# Patient Record
Sex: Male | Born: 1970 | Race: White | Hispanic: No | Marital: Single
Health system: Southern US, Community
[De-identification: ages and names within clinical notes are randomized; demographics above are authoritative.]

## PROBLEM LIST (undated history)

## (undated) DIAGNOSIS — N200 Calculus of kidney: Secondary | ICD-10-CM

## (undated) DIAGNOSIS — I10 Essential (primary) hypertension: Secondary | ICD-10-CM

## (undated) HISTORY — PX: ESOPHAGEAL DILATION: SHX303

## (undated) HISTORY — PX: VASECTOMY: SHX75

## (undated) MED ORDER — AZITHROMYCIN 250 MG TAB
250 mg | ORAL_TABLET | ORAL | Status: DC
Start: ? — End: 2013-05-25

---

## 2011-10-09 MED ORDER — TADALAFIL 20 MG TABLET
20 mg | ORAL_TABLET | ORAL | Status: DC | PRN
Start: 2011-10-09 — End: 2011-10-23

## 2011-10-09 NOTE — Patient Instructions (Signed)
Erectile Dysfunction: After Your Visit  Your Care Instructions  A man has erectile dysfunction (ED) when he cannot consistently get or maintain an erection that allows satisfactory sex. He may not be able to have an erection at any time, or he may not be able to have one that is firm enough or lasts long enough to complete sexual intercourse.  Erectile dysfunction is not the same as having trouble getting an erection every now and then. This is common and happens to most men at some time in their lives.  Erection problems are often caused by problems with the blood vessels, nerves, or hormones. These can be caused by health problems such as diabetes, heart disease, injuries, or nerve disorders like multiple sclerosis or Parkinson???s disease. Medicines, alcohol, and tobacco are other causes. Erection problems can also have a psychological or emotional cause like depression, stress, grief, or relationship problems.  Follow-up care is a key part of your treatment and safety. Be sure to make and go to all appointments, and call your doctor if you are having problems. It???s also a good idea to know your test results and keep a list of the medicines you take.  How can you care for yourself at home?  ?? Limit alcohol. Have no more than 2 drinks a day.   ?? Do not smoke. Smoking makes it harder for the blood vessels in the penis to relax and let blood flow in. If you need help quitting, talk to your doctor about stop-smoking programs and medicines. These can increase your chances of quitting for good.   ?? Do not use cocaine, heroin, or other illegal drugs.   ?? Tell your doctor about all the medicines that you take. Some medicines can cause erection problems. Also, some medicines can have dangerous interactions with medicines that are prescribed for ED, including over-the-counter medicines and herbal products. Take your medicines exactly as prescribed. Call your doctor if you think you are having a problem with your medicine.    ?? Try to reduce stress.   ?? Give yourself time to adjust to change. Changes in your job, family, relationships, home life, and other areas can cause stress that adds to erection problems.   ?? Relax. Take time for more foreplay. Worrying about your erections may only make things worse.   ?? Talk with your partner about your concerns:   ?? Couples often assume that they each know what the other person likes when it comes to sex. Sometimes they are wrong. Talk about what each of you does and does not enjoy.   ?? Make time outside of the bedroom to talk about your sex life. If you avoid sex because you are afraid of having erection problems, your partner may worry that you are no longer interested.   ?? If you and your partner have trouble talking about sex, see a therapist who can help you talk about it together. Reading books with your partner about sexual health may also help.   ?? Talk to your doctor about trying Viagra, Levitra, or Cialis. If you have a heart problem, ask your doctor whether these are safe for you.   When should you call for help?  Call your doctor now or seek immediate medical care if:  ?? You have an erection that lasts longer than 3 hours.   ?? You have taken Viagra, Levitra, or Cialis in the past 24 hours, and you have chest pain. Do not take nitroglycerin.   ??   You have erection problems that occur along with pain or difficulty with urination, fever, or pain in the lower belly.   Watch closely for changes in your health, and be sure to contact your doctor if you have any problems.    Where can you learn more?    Go to http://www.healthwise.net/[facility]   Enter J531 in the search box to learn more about "Erectile Dysfunction: After Your Visit."    ?? 2006-2013 Health[employer], Incorporated. Care instructions adapted under license by [employer] (which disclaims liability or warranty for this information). This care instruction is for use with your licensed healthcare professional. If you have questions  about a medical condition or this instruction, always ask your healthcare professional. Healthwise, Incorporated disclaims any warranty or liability for your use of this information.  Content Version: 9.7.130178; Last Revised: January 26, 2009

## 2011-10-09 NOTE — Progress Notes (Signed)
HISTORY OF PRESENT ILLNESS  Nathan Mcintyre is a 41 y.o. male.  HPI Comments: Erectile Dysfunction  Patient complains of erectile dysfunction.  Onset of dysfunction was 2 months ago and was gradual in onset.  Patient states the nature of difficulty is maintaining erection. Full erections occur never. Partial erections occur with intercourse, with masturbation, on awakening, while asleep, spontaneously. Libido is not affected. Risk factors for ED include possible risk factor CAD - patient is a smoker. We do not have any fasting labs. Patient denies history of cardiovascular disease, diabetes mellitus, neurologic disease, urologic disease, beta blocker use, antihypertensive medications, and hypogonadism. Patient's expectations as to sexual function would like to have sexual intercourse at least every night if not several nights per week.  Patient's description of relationship w/partner very loving and supportive. He does not feel any anxiety with her.  Previous treatment of ED includes Cialis - but it did not really help. He was on the 5 mg tablet. .         New Patient    Medication Refill        Review of Systems   All other systems reviewed and are negative.        Physical Exam   Nursing note and vitals reviewed.  Constitutional: He is oriented to person, place, and time.   Cardiovascular: Normal rate and regular rhythm.    Pulmonary/Chest: Effort normal and breath sounds normal.   Musculoskeletal: Normal range of motion.   Neurological: He is alert and oriented to person, place, and time. He has normal reflexes.   Skin: Skin is warm.   Psychiatric: He has a normal mood and affect. His behavior is normal.       ASSESSMENT and PLAN  Nathan Mcintyre was seen today for new patient and for evaluation of erectile dysfunction. He needs to have fasting labs and testosterone checked. Prostate exam was normal. Discussed smoking cessation. Will give Cialis 20 mgs and discussed the way to use the higher dose. He will return in the  morning for the fasting labs.     Diagnoses and associated orders for this visit:    Erectile dysfunction  - TESTOSTERONE, TOTAL, ADULT MALE  - tadalafil (CIALIS) 20 mg tablet; Take 20 mg by mouth as needed ( 20 mg 1-2 hours prior to anticipated sexual activity; may increase to 20 mg ORALLY or decrease ).    Routine physical examination  - LIPID CASCADE  - THYROID CASCADE PROFILE  - TESTOSTERONE, TOTAL, ADULT MALE  - METABOLIC PANEL, COMPREHENSIVE  - CBC WITH AUTOMATED DIFF    Follow-up Disposition:  Return if symptoms worsen or fail to improve.

## 2011-10-10 NOTE — Progress Notes (Signed)
Patient presented for labs as follow up from yesterday's visit.

## 2011-10-13 LAB — CBC WITH AUTOMATED DIFF
ABS. BASOPHILS: 0 10*3/uL (ref 0.0–0.2)
ABS. EOSINOPHILS: 0.1 10*3/uL (ref 0.0–0.4)
ABS. IMM. GRANS.: 0 10*3/uL (ref 0.0–0.1)
ABS. MONOCYTES: 1.2 10*3/uL — ABNORMAL HIGH (ref 0.1–0.9)
ABS. NEUTROPHILS: 10.3 10*3/uL — ABNORMAL HIGH (ref 1.4–7.0)
Abs Lymphocytes: 2.3 10*3/uL (ref 0.7–3.1)
BASOPHILS: 0 % (ref 0–3)
EOSINOPHILS: 1 % (ref 0–5)
HCT: 51.5 % — ABNORMAL HIGH (ref 37.5–51.0)
HGB: 17.3 g/dL (ref 12.6–17.7)
IMMATURE GRANULOCYTES: 0 % (ref 0–2)
Lymphocytes: 17 % (ref 14–46)
MCH: 30.4 pg (ref 26.6–33.0)
MCHC: 33.6 g/dL (ref 31.5–35.7)
MCV: 90 fL (ref 79–97)
MONOCYTES: 8 % (ref 4–12)
NEUTROPHILS: 74 % (ref 40–74)
PLATELET: 361 10*3/uL (ref 155–379)
RBC: 5.7 x10E6/uL (ref 4.14–5.80)
RDW: 14.3 % (ref 12.3–15.4)
WBC: 14 10*3/uL — ABNORMAL HIGH (ref 3.4–10.8)

## 2011-10-13 LAB — METABOLIC PANEL, COMPREHENSIVE
A-G Ratio: 1.5 (ref 1.1–2.5)
ALT (SGPT): 19 IU/L (ref 0–44)
AST (SGOT): 20 IU/L (ref 0–40)
Albumin: 4.3 g/dL (ref 3.5–5.5)
Alk. phosphatase: 121 IU/L — ABNORMAL HIGH (ref 44–102)
BUN/Creatinine ratio: 14 (ref 9–20)
BUN: 11 mg/dL (ref 6–24)
Bilirubin, total: 0.4 mg/dL (ref 0.0–1.2)
CO2: 22 mmol/L (ref 19–28)
Calcium: 9.1 mg/dL (ref 8.7–10.2)
Chloride: 103 mmol/L (ref 97–108)
Creatinine: 0.8 mg/dL (ref 0.76–1.27)
GFR est AA: 126 mL/min/{1.73_m2} (ref >59)
GFR est non-AA: 109 mL/min/{1.73_m2} (ref >59)
GLOBULIN, TOTAL: 2.8 g/dL (ref 1.5–4.5)
Glucose: 81 mg/dL (ref 65–99)
Potassium: 4.6 mmol/L (ref 3.5–5.2)
Protein, total: 7.1 g/dL (ref 6.0–8.5)
Sodium: 141 mmol/L (ref 134–144)

## 2011-10-13 LAB — LIPID CASCADE
Cholesterol, total: 180 mg/dL (ref 100–199)
HDL Cholesterol: 54 mg/dL (ref >39)
LDL, calculated: 108 mg/dL — ABNORMAL HIGH (ref 0–99)
LDL/HDL Ratio: 2 ratio units (ref 0.0–3.6)
Non-HDL Cholesterol: 126 mg/dL (ref 0–129)
Triglyceride: 88 mg/dL (ref 0–149)

## 2011-10-13 LAB — LIPOPROTEIN ANALYSIS, BY NMR
HDL-P (Total): 32.4 umol/L (ref >=30.5)
LDL size: 21.3 nm (ref >20.5)
LDL-P: 1261 nmol/L — ABNORMAL HIGH (ref <1000)
LP-IR SCORE: 34 (ref <=45)
Small LDL-P: 385 nmol/L (ref <=527)

## 2011-10-13 LAB — TESTOSTERONE, TOTAL, ADULT MALE: Testosterone: 470 ng/dL (ref 348–1197)

## 2011-10-13 LAB — THYROID CASCADE PROFILE: TSH: 0.715 u[IU]/mL (ref 0.450–4.500)

## 2011-10-23 MED ORDER — TADALAFIL 5 MG TABLET
5 mg | ORAL_TABLET | ORAL | Status: AC | PRN
Start: 2011-10-23 — End: 2011-11-06

## 2011-10-23 MED ORDER — BUPROPION SR 150 MG TAB
150 mg | ORAL_TABLET | Freq: Every day | ORAL | Status: AC
Start: 2011-10-23 — End: 2011-11-22

## 2011-10-23 NOTE — Telephone Encounter (Signed)
Patient called to receive lab results from labs done on 9/26

## 2011-10-23 NOTE — Telephone Encounter (Signed)
Patient coming in today to discuss erectile dysfunction issues due to current medication not working any more

## 2011-10-23 NOTE — Progress Notes (Signed)
Patient presents for erectile dysfunction due to current medication not working at all. Patient also asking for his flu vaccine at this time Reviewed record in preparation for visit and have obtained necessary documentation.

## 2011-10-24 LAB — CBC WITH AUTOMATED DIFF
ABS. BASOPHILS: 0 10*3/uL (ref 0.0–0.2)
ABS. EOSINOPHILS: 0.2 10*3/uL (ref 0.0–0.4)
ABS. IMM. GRANS.: 0 10*3/uL (ref 0.0–0.1)
ABS. MONOCYTES: 1 10*3/uL — ABNORMAL HIGH (ref 0.1–0.9)
ABS. NEUTROPHILS: 8.4 10*3/uL — ABNORMAL HIGH (ref 1.4–7.0)
Abs Lymphocytes: 3.5 10*3/uL — ABNORMAL HIGH (ref 0.7–3.1)
BASOPHILS: 0 % (ref 0–3)
EOSINOPHILS: 2 % (ref 0–5)
HCT: 50.2 % (ref 37.5–51.0)
HGB: 16.9 g/dL (ref 12.6–17.7)
IMMATURE GRANULOCYTES: 0 % (ref 0–2)
Lymphocytes: 26 % (ref 14–46)
MCH: 30.1 pg (ref 26.6–33.0)
MCHC: 33.7 g/dL (ref 31.5–35.7)
MCV: 90 fL (ref 79–97)
MONOCYTES: 8 % (ref 4–12)
NEUTROPHILS: 64 % (ref 40–74)
PLATELET: 354 10*3/uL (ref 155–379)
RBC: 5.61 x10E6/uL (ref 4.14–5.80)
RDW: 14.2 % (ref 12.3–15.4)
WBC: 13.2 10*3/uL — ABNORMAL HIGH (ref 3.4–10.8)

## 2011-10-24 NOTE — Progress Notes (Signed)
HISTORY OF PRESENT ILLNESS  Nathan Mcintyre is a 41 y.o. male.  HPI Comments: Who presents for ongoing problems with erectile dysfunction. He does not have any offending medications. No hypertension. Testosterone levels were normal. He is not overweight. He has tried Cialis and also Viagra with minimal improvement. His labs have all returned normal. He does smoke and drinks minimally. He is discouraged about this. He is in a new relationship for the past 29-months and denies performance anxiety.       Review of Systems   All other systems reviewed and are negative.        Physical Exam   Nursing note and vitals reviewed.  Cardiovascular: Normal rate and regular rhythm.    Pulmonary/Chest: Effort normal and breath sounds normal.       ASSESSMENT and PLAN  Lajohn was seen today for erectile dysfunction and immunization/injection. Will refer to urology. He did receive the influenza vaccine today. Will follow up after referral. Will add Wellbutrin and daily cialis to his regimen. He had taken the 20mg  Cialis previously.     Diagnoses and associated orders for this visit:    Erectile dysfunction  - REFERRAL TO UROLOGY  - buPROPion SR (WELLBUTRIN SR) 150 mg SR tablet; Take 1 Tab by mouth daily for 30 days.  - tadalafil (CIALIS) 5 mg tablet; Take 5 mg by mouth as needed for 14 days.    Need for prophylactic vaccination and inoculation against influenza  - INFLUENZA VIRUS VACCINE, SPLIT, PRES. FREE, IN INDIVIDS. >=3 YRS OF AGE, IM    Libido, decreased  - buPROPion SR (WELLBUTRIN SR) 150 mg SR tablet; Take 1 Tab by mouth daily for 30 days.  - tadalafil (CIALIS) 5 mg tablet; Take 5 mg by mouth as needed for 14 days.    Abnormal cbc  - CBC WITH AUTOMATED DIFF    Follow-up Disposition:  Return if symptoms worsen or fail to improve.

## 2012-01-21 NOTE — Progress Notes (Signed)
HISTORY OF PRESENT ILLNESS  Nathan Mcintyre is a 42 y.o. male.  HPI Comments: Who presents today with onset of cough and low grade fever. Fever is gone today, but now having itchy and scratchy throat and nasal congestion. He is a smoker. He continues to smoke.     Cough    Fever   Associated symptoms include cough.   Light Sensitivity        Review of Systems   Constitutional: Positive for fever.   Eyes: Positive for photophobia.   Respiratory: Positive for cough.    All other systems reviewed and are negative.        Physical Exam   Nursing note and vitals reviewed.  Constitutional:   Fatigued     HENT:   Head: Normocephalic.   Eyes: Pupils are equal, round, and reactive to light.   Neck: Normal range of motion.   Cardiovascular: Normal rate and regular rhythm.    Pulmonary/Chest: No respiratory distress. He has no wheezes.   Bronchospastic cough   Musculoskeletal: Normal range of motion.   Neurological: He is alert.   Skin: Skin is warm.       ASSESSMENT and PLAN  [name] was seen today for cough, fever and light sensitivity.    Diagnoses and associated orders for this visit:    URI (upper respiratory infection)  - azithromycin (ZITHROMAX Z-PAK) 250 mg tablet; Take two tablets today then one tablet daily    Photophobia of both eyes    Follow-up Disposition:  Return if symptoms worsen or fail to improve.

## 2012-01-21 NOTE — Progress Notes (Signed)
Patient started with dry cough and slight fever this am. Also complains of light sensitivity for the past month mostly during the day. Denies any other medical issues at this time. Med reconciliation reviewed and up to date with  corrections made with patient at this time.  Reviewed record in preparation for visit and have obtained necessary documentation.

## 2013-05-25 NOTE — Progress Notes (Signed)
Identified pt with two pt identifiers(name and DOB).    Chief Complaint   Patient presents with   ??? Complete Physical     pt is fasting        Health Maintenance Due   Topic   ??? TDAP AGE > 18    ??? Td Q 10 Yrs Age > 18        Wt Readings from Last 3 Encounters:   05/25/13 189 lb (85.73 kg)   01/21/12 202 lb 6.4 oz (91.808 kg)   10/23/11 196 lb 3.2 oz (88.996 kg)     Temp Readings from Last 3 Encounters:   05/25/13 98 ??F (36.7 ??C) Oral   01/21/12 98.2 ??F (36.8 ??C) Oral   10/23/11 97.5 ??F (36.4 ??C) Oral     BP Readings from Last 3 Encounters:   05/25/13 138/80   01/21/12 129/75   10/23/11 117/78     Pulse Readings from Last 3 Encounters:   05/25/13 64   01/21/12 77   10/23/11 62         Learning Assessment:  :     Learning Assessment 01/21/2012   PRIMARY LEARNER Patient   HIGHEST LEVEL OF EDUCATION - PRIMARY LEARNER  DID NOT GRADUATE HIGH SCHOOL   BARRIERS PRIMARY LEARNER NONE   CO-LEARNER CAREGIVER No   PRIMARY LANGUAGE ENGLISH   LEARNER PREFERENCE PRIMARY DEMONSTRATION   ANSWERED BY patient   RELATIONSHIP SELF       Depression Screening:  :     PHQ 2 / 9, over the last two weeks 05/25/2013   Little interest or pleasure in doing things Not at all   Feeling down, depressed or hopeless Not at all   Total Score PHQ 2 0       Fall Risk Assessment:  :         Coordination of Care Questionnaire:  :     1) Have you been to an emergency room, urgent care clinic since your last visit? no   Hospitalized since your last visit? no             2) Have you seen or consulted any other health care providers outside of [facility] since your last visit? no  (Include any pap smears or colon screenings in this section.)    3) Do you have an Advance Directive on file? no  Are you interested in receiving information about Advance Directives? no    Patient is accompanied by self I have received verbal consent from Mallie DartingWilliam A Perona to discuss any/all medical information while they are present in the room.    Reviewed record in  preparation for visit and have obtained necessary documentation.

## 2013-05-25 NOTE — Patient Instructions (Signed)
The Mediterranean Diet: After Your Visit  Your Care Instructions  The Mediterranean diet features foods eaten in [place], [place], southern [place] and [place], and other countries that border the [place] Sea. It emphasizes eating a diet rich in fruits, vegetables, nuts, and high-fiber grains, and limits meat, cheese, and sweets.  The Mediterranean diet may:  ?? Prevent heart disease and lower the risk of a heart attack or stroke.  ?? Prevent type 2 diabetes.  ?? Prevent Alzheimer's disease and other dementia.  ?? Prevent depression.  ?? Prevent Parkinson's disease.  This diet contains more fat than other heart-healthy diets. But the fats are mainly from nuts, unsaturated oils, such as fish oils, olive oil, and certain nut or seed oils (such as canola, soybean, or flaxseed oil). These types of oils may help protect the heart and blood vessels.  Follow-up care is a key part of your treatment and safety. Be sure to make and go to all appointments, and call your doctor if you are having problems. It's also a good idea to know your test results and keep a list of the medicines you take.  How can you care for yourself at home?  What to eat  ?? Eat a variety of fruits and vegetables each day, such as grapes, blueberries, tomatoes, broccoli, peppers, figs, olives, spinach, eggplant, beans, lentils, and chickpeas.  ?? Eat a variety of whole-grain foods each day, such as oats, brown rice, and whole wheat bread, pasta, and couscous.  ?? Eat fish at least 2 times a week. Try tuna, salmon, mackerel, lake trout, herring, or sardines.  ?? Eat moderate amounts of low-fat dairy products each day or weekly, such as milk, cheese, or yogurt.  ?? Eat moderate amounts of poultry and eggs every 2 days or weekly.  ?? Choose healthy (unsaturated) fats, such as nuts, olive oil and certain nut or seed oils like canola, soybean, and flaxseed.  ?? Limit unhealthy (saturated) fats, such as butter, palm oil, and coconut oil. And limit fats found in animal  products, such as meat and dairy products made with whole milk. Try to eat red meat only a few times a month in very small amounts.  ?? Limit sweets and desserts to only a few times a week. This includes sugar-sweetened drinks like soda.  The Mediterranean diet may also include red wine with your meal???1 glass each day for women and up to 2 glasses a day for men.  Tips for changing your diet  ?? Dip bread in a mix of olive oil and fresh herbs instead of using butter.  ?? Add avocado slices to your sandwich instead of bacon.  ?? Have fish for lunch or dinner instead of red meat. Brush the fish with olive oil, and broil or grill it.  ?? Sprinkle your salad with seeds or nuts instead of cheese.  ?? Cook with olive or canola oil instead of butter or oils that are high in saturated fat.  ?? Switch from 2% milk or whole milk to 1% or fat-free milk.  ?? Dip raw vegetables in a vinaigrette dressing or hummus instead of dips made from mayonnaise or sour cream.  ?? Have a piece of fruit for dessert instead of a piece of cake. Try baked apples, or have some dried fruit.  Part of the Mediterranean diet is being active. Get at least 30 minutes of exercise on most days of the week. Walking is a good choice. You also may want to do other activities,   such as running, swimming, cycling, or playing tennis or team sports.   Where can you learn more?   Go to http://www.healthwise.net/[facility]  Enter O407 in the search box to learn more about "The Mediterranean Diet: After Your Visit."   ?? 2006-2015 Healthwise, Incorporated. Care instructions adapted under license by [employer] (which disclaims liability or warranty for this information). This care instruction is for use with your licensed healthcare professional. If you have questions about a medical condition or this instruction, always ask your healthcare professional. Healthwise, Incorporated disclaims any warranty or liability for your use of this information.  Content Version:  10.4.390249; Current as of: November 27, 2012

## 2013-05-25 NOTE — Progress Notes (Signed)
Subjective:     Nathan Mcintyre is a [age] y.o. male prMallie Dartingesenting for annual exam and complete physical.    Patient Active Problem List    Diagnosis Date Noted   ??? Erectile dysfunction 10/23/2011       No Known Allergies  History reviewed. No pertinent past medical history.  Past Surgical History   Procedure Laterality Date   ??? Hx vasectomy  2001     Family History   Problem Relation Age of Onset   ??? Cancer Father      lymph node     History   Substance Use Topics   ??? Smoking status: Current Every Day Smoker -- 0.50 packs/day for 20 years   ??? Smokeless tobacco: Never Used   ??? Alcohol Use: 1.0 oz/week     2 Cans of beer per week      Comment: occ          Component Value Date/Time   WBC 13.2 10/23/2011  4:28 PM   HGB 16.9 10/23/2011  4:28 PM   HCT 50.2 10/23/2011  4:28 PM   PLATELET 354 10/23/2011  4:28 PM   MCV 90 10/23/2011  4:28 PM       Component Value Date/Time   Cholesterol, total 180 10/10/2011  8:56 AM   HDL Cholesterol 54 10/10/2011  8:56 AM   LDL, calculated 108 10/10/2011  8:56 AM   Triglyceride 88 10/10/2011  8:56 AM     No results found for this basename: PSA, PSA2, PSAR1, [id number], PSA1, PSAR2, PSA3, PSAR3, [id number], [id number], [id number], [id number], PSALT    Component Value Date/Time   TSH 0.715 10/10/2011  8:56 AM         Review of Systems  A comprehensive review of systems was negative.    Objective:     BP 138/80   Pulse 64   Temp(Src) 98 ??F (36.7 ??C) (Oral)   Resp 16   Ht 5' 11.5" (1.816 m)   Wt 189 lb (85.73 kg)   BMI 26.00 kg/m2   SpO2 97%  Physical exam:   General appearance - alert, well appearing, and in no distress and normal appearing weight  Mental status - alert, oriented to person, place, and time  Eyes - pupils equal and reactive, extraocular eye movements intact  Ears - bilateral TM's and external ear canals normal  Nose - normal and patent, no erythema, discharge or polyps  Mouth - mucous membranes moist, pharynx normal without lesions  Neck - supple, no significant adenopathy  Lymphatics - no palpable  lymphadenopathy, no hepatosplenomegaly  Chest - clear to auscultation, no wheezes, rales or rhonchi, symmetric air entry  Heart - normal rate, regular rhythm, normal S1, S2, no murmurs, rubs, clicks or gallops  Abdomen - soft, nontender, nondistended, no masses or organomegaly  Back exam - full range of motion, no tenderness, palpable spasm or pain on motion  Neurological - alert, oriented, normal speech, no focal findings or movement disorder noted  Musculoskeletal - no joint tenderness, deformity or swelling  Extremities - peripheral pulses normal, no pedal edema, no clubbing or cyanosis  Skin - normal coloration and turgor, no rashes, no suspicious skin lesions noted     Assessment/Plan:       increase physical activity, routine labs ordered, call if any problems.     ICD-9-CM    1. Physical exam V70.9 CBC W/O DIFF     METABOLIC PANEL, COMPREHENSIVE     LIPID PANEL  THYROID CASCADE PROFILE   2. Need for diphtheria-tetanus-pertussis (Tdap) vaccine V06.1 TETANUS, DIPHTHERIA TOXOIDS AND ACELLULAR PERTUSSIS VACCINE (TDAP), IN INDIVIDS. >=7, IM   3. Screening for cholesterol level V77.91 LIPID PANEL   4. Screening for prostate cancer V76.44 PROSTATE SPECIFIC AG (PSA)   5. Screening for thyroid disorder V77.0 THYROID CASCADE PROFILE   .  Informed patient that we will notify him when his labs return, and inform him of any change in plan of care at that time.  Educated patient about common side effects of the vaccine, and informed patient that he will be due for another booster in 10 years.    Pt informed to return to office with worsening of symptoms, or PRN with any questions or concerns.  Pt verbalizes understanding of plan of care and denies further questions or concerns at this time.

## 2013-05-26 LAB — METABOLIC PANEL, COMPREHENSIVE
A-G Ratio: 1.8 (ref 1.1–2.5)
ALT (SGPT): 14 IU/L (ref 0–44)
AST (SGOT): 16 IU/L (ref 0–40)
Albumin: 4.6 g/dL (ref 3.5–5.5)
Alk. phosphatase: 116 IU/L (ref 39–117)
BUN/Creatinine ratio: 16 (ref 9–20)
BUN: 12 mg/dL (ref 6–24)
Bilirubin, total: 0.4 mg/dL (ref 0.0–1.2)
CO2: 23 mmol/L (ref 18–29)
Calcium: 9.6 mg/dL (ref 8.7–10.2)
Chloride: 100 mmol/L (ref 97–108)
Creatinine: 0.73 mg/dL — ABNORMAL LOW (ref 0.76–1.27)
GFR est AA: 130 mL/min/{1.73_m2} (ref >59)
GFR est non-AA: 112 mL/min/{1.73_m2} (ref >59)
GLOBULIN, TOTAL: 2.5 g/dL (ref 1.5–4.5)
Glucose: 90 mg/dL (ref 65–99)
Potassium: 4.7 mmol/L (ref 3.5–5.2)
Protein, total: 7.1 g/dL (ref 6.0–8.5)
Sodium: 140 mmol/L (ref 134–144)

## 2013-05-26 LAB — CBC W/O DIFF
HCT: 49.9 % (ref 37.5–51.0)
HGB: 16.4 g/dL (ref 12.6–17.7)
MCH: 30.2 pg (ref 26.6–33.0)
MCHC: 32.9 g/dL (ref 31.5–35.7)
MCV: 92 fL (ref 79–97)
PLATELET: 309 10*3/uL (ref 150–379)
RBC: 5.43 x10E6/uL (ref 4.14–5.80)
RDW: 14.5 % (ref 12.3–15.4)
WBC: 11.4 10*3/uL — ABNORMAL HIGH (ref 3.4–10.8)

## 2013-05-26 LAB — CVD REPORT

## 2013-05-26 LAB — LIPID PANEL
Cholesterol, total: 213 mg/dL — ABNORMAL HIGH (ref 100–199)
HDL Cholesterol: 56 mg/dL (ref >39)
LDL, calculated: 136 mg/dL — ABNORMAL HIGH (ref 0–99)
Triglyceride: 103 mg/dL (ref 0–149)
VLDL, calculated: 21 mg/dL (ref 5–40)

## 2013-05-26 LAB — THYROID CASCADE PROFILE: TSH: 0.795 u[IU]/mL (ref 0.450–4.500)

## 2013-05-26 LAB — PSA, DIAGNOSTIC (PROSTATE SPECIFIC AG): Prostate Specific Ag: 0.8 ng/mL (ref 0.0–4.0)

## 2013-05-26 NOTE — Progress Notes (Signed)
Quick Note:    Please call the patient and let him know the following in regards to his labs:  1. His WBC's are elevated. As he was not complaining of any cold symptoms, etc in the visit, this is abnormal. I would like for him to return in 1-2 weeks for us to re-check this, and ensure he does not have an infection, etc.  2. His cholesterol is mildly elevated. He needs to work hard on diet and lifestyle changes. Decrease fatty, fried foods. Decrease creamy sauces. Increase fruits, vegetables, fiber, lean meats. Increase exercise. We will re-check this in 6 months, and if still elevated, would want him to be placed on a cholesterol medication.  Everything else looked normal!  ______

## 2013-05-27 NOTE — Telephone Encounter (Signed)
See note below.  Advised pt of lab results & recommendations per madeline kilm, fnp.    Pt will come in next week for labs.

## 2013-05-27 NOTE — Telephone Encounter (Signed)
-----   Message from Sylvie FarrierMadeline R Klim, NP sent at 05/26/2013  3:49 PM EDT -----  Please call the patient and let him know the following in regards to his labs:  1.  His WBC's are elevated.  As he was not complaining of any cold symptoms, etc in the visit, this is abnormal.  I would like for him to return in 1-2 weeks for us to re-check this, and ensure he does not have an infection, etc.  2.  His cholesterol is mildly elevated.  He needs to work hard on diet and lifestyle changes.  Decrease fatty, fried foods.  Decrease creamy sauces.  Increase fruits, vegetables, fiber, lean meats.  Increase exercise.  We will re-check this in 6 months, and if still elevated, would want him to be placed on a cholesterol medication.  Everything else looked normal!

## 2013-12-29 ENCOUNTER — Ambulatory Visit
Admit: 2013-12-29 | Discharge: 2013-12-29 | Payer: PRIVATE HEALTH INSURANCE | Attending: Family | Primary: Internal Medicine

## 2013-12-29 DIAGNOSIS — N454 Abscess of epididymis or testis: Secondary | ICD-10-CM

## 2013-12-29 MED ORDER — TRIMETHOPRIM-SULFAMETHOXAZOLE 160 MG-800 MG TAB
160-800 mg | ORAL_TABLET | Freq: Two times a day (BID) | ORAL | Status: AC
Start: 2013-12-29 — End: 2014-01-08

## 2013-12-29 NOTE — Progress Notes (Signed)
Chief Complaint   Patient presents with   ??? Skin Problem     swollen testicle after squeezing at small seed like spot.   states in the middle of both testicles     "REVIEWED RECORD IN PREPARATION FOR VISIT AND HAVE OBTAINED THE NECESSARY DOCUMENTATION"  1. Have you been to the ER, urgent care clinic since your last visit?  Hospitalized since your last visit?No    2. Have you seen or consulted any other health care providers outside of the [facility] since your last visit?  Include any pap smears or colon screening. No  Patient does not have advanced directives.

## 2013-12-29 NOTE — Progress Notes (Signed)
HISTORY OF PRESENT ILLNESS  Nathan DartingWilliam A Mcintyre is a 43 y.o. male.  HPI    CC:  "  I popped this boyle on my R testicle .."      Pt and his wife present to clinic w/ 2 day complaints of a small, seed size pustule he popped  on his L testicle on Monday that got infected and swollen to the size of a golf ball, red and wam w/ streaks running down his legs.     Pt also had been running fevers, ranging from 99-100.     Pt popped it again yesterday w/ copious purulent drainage.       Pt denies any urinary changes nor difficulty passing urine.     Pt reports having had similar "pimples" in the past but they were never this severe.           ROS  See above     Physical Exam   Cardiovascular: Normal rate, regular rhythm and normal heart sounds.  Exam reveals no gallop and no friction rub.    No murmur heard.  Pulmonary/Chest: Effort normal and breath sounds normal. No respiratory distress. He has no wheezes. He has no rales. He exhibits no tenderness.   Genitourinary: Penis normal.       Right testis shows swelling. Left testis shows swelling. Circumcised.   Nursing note and vitals reviewed.      ASSESSMENT and PLAN    ICD-10-CM ICD-9-CM    1. Testicular abscess N45.4 604.0 REFERRAL TO UROLOGY      trimethoprim-sulfamethoxazole (BACTRIM DS, SEPTRA DS) 160-800 mg per tablet     Given Pt's S&S, have asked office staff to expedite urology appointment for eva/I&D ASAP.     Will start Pt on PO Bactrim and advised him based on appt, if he had to wait more than 24 hours for appt, to go ahead and start abx until directed otherwise by urologist.     Discussed which S&S warranted ED follow-up, especially if Pt stopped passing urine.     Pt expressed understanding of the treatment plan and repeated back instructions for medications, referral to urology and follow-up.

## 2013-12-29 NOTE — Patient Instructions (Signed)
Skin Abscess: After Your Visit  Your Care Instructions     A skin abscess is a bacterial infection that forms a pocket of pus. A boil is a kind of skin abscess. The doctor may have cut an opening in the abscess so that the pus can drain out. You may have gauze in the cut so that the abscess will stay open and keep draining. You may need antibiotics. You will need to follow up with your doctor to make sure the infection has gone away.  The doctor has checked you carefully, but problems can develop later. If you notice any problems or new symptoms, get medical treatment right away.  Follow-up care is a key part of your treatment and safety. Be sure to make and go to all appointments, and call your doctor if you are having problems. It's also a good idea to know your test results and keep a list of the medicines you take.  How can you care for yourself at home?  ?? Apply warm and dry compresses, a heating pad set on low, or a hot water bottle 3 or 4 times a day for pain. Keep a cloth between the heat source and your skin.  ?? If your doctor prescribed antibiotics, take them as directed. Do not stop taking them just because you feel better. You need to take the full course of antibiotics.  ?? Take pain medicines exactly as directed.  ?? If the doctor gave you a prescription medicine for pain, take it as prescribed.  ?? If you are not taking a prescription pain medicine, ask your doctor if you can take an over-the-counter medicine.  ?? Keep your bandage clean and dry. Change the bandage whenever it gets wet or dirty, or at least one time a day.  ?? If the abscess was packed with gauze:  ?? Keep follow-up appointments to have the gauze changed or removed. If the doctor instructed you to remove the gauze, gently pull out all of the gauze when your doctor tells you to.  ?? After the gauze is removed, soak the area in warm water for 15 to 20 minutes 2 times a day, until the wound closes.  When should you call for help?   Call your doctor now or seek immediate medical care if:  ?? You have signs of worsening infection, such as:  ?? Increased pain, swelling, warmth, or redness.  ?? Red streaks leading from the infected skin.  ?? Pus draining from the wound.  ?? A fever.  Watch closely for changes in your health, and be sure to contact your doctor if:  ?? You do not get better as expected.   Where can you learn more?   Go to http://www.healthwise.net/[facility]  Enter D633 in the search box to learn more about "Skin Abscess: After Your Visit."   ?? 2006-2015 [employer]. Care instructions adapted under license by [facility] (which disclaims liability or warranty for this information). This care instruction is for use with your licensed healthcare professional. If you have questions about a medical condition or this instruction, always ask your healthcare professional. Healthwise, Incorporated disclaims any warranty or liability for your use of this information.  Content Version: 10.5.422740; Current as of: March 05, 2013

## 2014-09-06 MED ORDER — VARENICLINE 0.5 MG (11)-1 MG (3X14) TABS IN A DOSE PACK
0.5 mg (11)- 1 mg (42) | Freq: Every day | ORAL | 0 refills | Status: AC
Start: 2014-09-06 — End: 2014-10-06

## 2014-09-06 NOTE — Telephone Encounter (Signed)
[facility] [place]. States that smoking cessation was discussed about a year ago and he is ready to stop smoking now.      Patient's best contact [phone] OR [phone]      THANKS.

## 2014-09-07 NOTE — Telephone Encounter (Signed)
Patient advised of Dr.Joseph's recommendations. Agrees to plan.

## 2014-09-08 NOTE — Telephone Encounter (Signed)
[facility] pharmacy called, looking for the chantix script. Gave information over the phone to pharmacist to be filled. Patient was at the pharmacy.

## 2014-10-06 NOTE — Telephone Encounter (Signed)
Pt was on a chantix starter pack and 10/07/14 will be the last dose he has and needs a refill. Not seeing the chantix to order other that starter pack. [facility] [place]

## 2014-10-06 NOTE — Telephone Encounter (Signed)
Returned pt's phone call and advised him he will need to be seen for appt as advised on 09/06/14 when he requested that original prescription. Offered for pt to be seen tomorrow so he can continue his treatment without break in medication and he declines stating, "Alright then, I don't think I'll worry about it then. Nevermind cause I can answer any questions needed to continue by phone and shouldn't need to come in for appointment".

## 2017-11-05 ENCOUNTER — Other Ambulatory Visit: Payer: Self-pay

## 2017-11-05 ENCOUNTER — Emergency Department (HOSPITAL_BASED_OUTPATIENT_CLINIC_OR_DEPARTMENT_OTHER): Payer: BLUE CROSS/BLUE SHIELD

## 2017-11-05 ENCOUNTER — Encounter (HOSPITAL_BASED_OUTPATIENT_CLINIC_OR_DEPARTMENT_OTHER): Payer: Self-pay | Admitting: Emergency Medicine

## 2017-11-05 ENCOUNTER — Emergency Department (HOSPITAL_BASED_OUTPATIENT_CLINIC_OR_DEPARTMENT_OTHER)
Admission: EM | Admit: 2017-11-05 | Discharge: 2017-11-05 | Disposition: A | Discharge status: Home / Self Care | Payer: BLUE CROSS/BLUE SHIELD | Attending: Emergency Medicine | Admitting: Emergency Medicine

## 2017-11-05 DIAGNOSIS — R079 Chest pain, unspecified: Secondary | ICD-10-CM | POA: Diagnosis not present

## 2017-11-05 DIAGNOSIS — R109 Unspecified abdominal pain: Secondary | ICD-10-CM | POA: Diagnosis present

## 2017-11-05 DIAGNOSIS — I10 Essential (primary) hypertension: Secondary | ICD-10-CM | POA: Insufficient documentation

## 2017-11-05 HISTORY — DX: Calculus of kidney: N20.0

## 2017-11-05 HISTORY — DX: Essential (primary) hypertension: I10

## 2017-11-05 LAB — CBC
HCT: 50.1 % (ref 39.0–52.0)
Hemoglobin: 16.3 g/dL (ref 13.0–17.0)
MCH: 30.5 pg (ref 26.0–34.0)
MCHC: 32.5 g/dL (ref 30.0–36.0)
MCV: 93.6 fL (ref 80.0–100.0)
PLATELETS: 314 10*3/uL (ref 150–400)
RBC: 5.35 MIL/uL (ref 4.22–5.81)
RDW: 13.4 % (ref 11.5–15.5)
WBC: 14.5 10*3/uL — ABNORMAL HIGH (ref 4.0–10.5)
nRBC: 0 % (ref 0.0–0.2)

## 2017-11-05 LAB — TROPONIN I

## 2017-11-05 LAB — BASIC METABOLIC PANEL
ANION GAP: 6 (ref 5–15)
BUN: 13 mg/dL (ref 6–20)
CALCIUM: 9.3 mg/dL (ref 8.9–10.3)
CO2: 29 mmol/L (ref 22–32)
CREATININE: 0.81 mg/dL (ref 0.61–1.24)
Chloride: 102 mmol/L (ref 98–111)
GFR calc Af Amer: >60 mL/min (ref >60)
GLUCOSE: 100 mg/dL — AB (ref 70–99)
Potassium: 4.1 mmol/L (ref 3.5–5.1)
Sodium: 137 mmol/L (ref 135–145)

## 2017-11-05 LAB — URINALYSIS, ROUTINE W REFLEX MICROSCOPIC
BILIRUBIN URINE: NEGATIVE
GLUCOSE, UA: NEGATIVE mg/dL
KETONES UR: NEGATIVE mg/dL
LEUKOCYTES UA: NEGATIVE
Nitrite: NEGATIVE
PH: 7 (ref 5.0–8.0)
PROTEIN: NEGATIVE mg/dL
Specific Gravity, Urine: 1.015 (ref 1.005–1.030)

## 2017-11-05 LAB — URINALYSIS, MICROSCOPIC (REFLEX)

## 2017-11-05 MED ORDER — IOPAMIDOL (ISOVUE-370) INJECTION 76%
100.0000 mL | Freq: Once | INTRAVENOUS | Status: AC | PRN
  Administered 2017-11-05: 100 mL via INTRAVENOUS

## 2017-11-05 MED ORDER — SODIUM CHLORIDE 0.9 % IV BOLUS
500.0000 mL | Freq: Once | INTRAVENOUS | Status: AC
  Administered 2017-11-05: 500 mL via INTRAVENOUS

## 2017-11-05 NOTE — ED Notes (Signed)
Patient transported to CT 

## 2017-11-05 NOTE — Discharge Instructions (Addendum)
Follow-up closely with a primary care doctor as you may need further evaluation for your bone pain which may include cancer or other musculoskeletal causes. Return for worsening or new symptoms. You can take Tylenol and Motrin as needed for pain.

## 2017-11-05 NOTE — ED Provider Notes (Addendum)
Is is [facility] EMERGENCY DEPARTMENT Provider Note   CSN: [id number] Arrival date & time: 11/05/17  4696     History   Chief Complaint Chief Complaint  Patient presents with  . Flank Pain  . Dizziness  . Chest Pain    HPI Timothy Simmons is a 47 y.o. male.  Patient with history of high blood pressure and multiple kidney stones presents with recurrent left flank pain for 7 days.  Patient's been treating as muscular skeletal however no improvement.  Patient has been taking muscle relaxant and Toradol/over-the-counter's as needed.  No injuries recalled.  Patient had intermittent brief sharp chest pain a few times a few days ago.  Currently no chest discomfort.  Patient says that he can feel his pulse in his neck more prominent.  No radiation to the back.  Patient does feels worse with movement.     Past Medical History:  Diagnosis Date  . Hypertension   . Kidney stones     There are no active problems to display for this patient.   Past Surgical History:  Procedure Laterality Date  . ESOPHAGEAL DILATION    . VASECTOMY          Home Medications    Prior to Admission medications   Not on File    Family History No family history on file.  Social History Social History   Tobacco Use  . Smoking status: Not on file  Substance Use Topics  . Alcohol use: Not on file  . Drug use: Not on file     Allergies   Patient has no known allergies.   Review of Systems Review of Systems  Constitutional: Negative for chills and fever.  HENT: Negative for congestion.   Eyes: Negative for visual disturbance.  Respiratory: Negative for shortness of breath.   Cardiovascular: Positive for chest pain.  Gastrointestinal: Negative for abdominal pain and vomiting.  Genitourinary: Positive for flank pain. Negative for dysuria.  Musculoskeletal: Negative for back pain, neck pain and neck stiffness.  Skin: Negative for rash.  Neurological: Positive for  light-headedness. Negative for headaches.     Physical Exam Updated Vital Signs BP (!) 144/95 (BP Location: Right Arm)   Pulse 73   Temp 98.4 F (36.9 C) (Oral)   Resp 19   Ht 5\' 11"  (1.803 m)   Wt 91.6 kg   SpO2 97%   BMI 28.17 kg/m   Physical Exam  Constitutional: He is oriented to person, place, and time. He appears well-developed and well-nourished.  HENT:  Head: Normocephalic and atraumatic.  Eyes: Conjunctivae are normal. Right eye exhibits no discharge. Left eye exhibits no discharge.  Neck: Normal range of motion. Neck supple. No tracheal deviation present.  Cardiovascular: Normal rate, regular rhythm, intact distal pulses and normal pulses.  Pulmonary/Chest: Effort normal and breath sounds normal.  Abdominal: Soft. He exhibits no distension. There is no tenderness. There is no guarding.  Musculoskeletal: Normal range of motion. He exhibits no edema.       Right lower leg: He exhibits no edema.       Left lower leg: He exhibits no edema.  Neurological: He is alert and oriented to person, place, and time.  Skin: Skin is warm. No rash noted.  Psychiatric: He has a normal mood and affect.  Nursing note and vitals reviewed.    ED Treatments / Results  Labs (all labs ordered are listed, but only abnormal results are displayed) Labs Reviewed  BASIC METABOLIC PANEL -  Abnormal; Notable for the following components:      Result Value   Glucose, Bld 100 (*)    All other components within normal limits  CBC - Abnormal; Notable for the following components:   WBC 14.5 (*)    All other components within normal limits  URINALYSIS, ROUTINE W REFLEX MICROSCOPIC - Abnormal; Notable for the following components:   Hgb urine dipstick TRACE (*)    All other components within normal limits  URINALYSIS, MICROSCOPIC (REFLEX) - Abnormal; Notable for the following components:   Bacteria, UA RARE (*)    All other components within normal limits  TROPONIN I    EKG EKG  Interpretation  Date/Time:  Wednesday November 05 2017 10:22:22 EDT Ventricular Rate:  71 PR Interval:    QRS Duration: 113 QT Interval:  375 QTC Calculation: 408 R Axis:   -26 Text Interpretation:  Sinus rhythm Borderline intraventricular conduction delay Confirmed by Blane Ohara [phone]) on 11/05/2017 10:57:01 AM   Radiology Dg Chest 2 View  Result Date: 11/05/2017 CLINICAL DATA:  Chest pain and nausea for 2 days. EXAM: CHEST - 2 VIEW COMPARISON:  None. FINDINGS: The cardiac silhouette, mediastinal and hilar contours are normal. The lungs are clear. No pleural effusion. The bony thorax is intact. IMPRESSION: Normal chest x-ray. Electronically Signed   By: Rudie Meyer M.D.   On: 11/05/2017 11:14   Ct Renal Stone Study  Result Date: 11/05/2017 CLINICAL DATA:  Left flank pain for 7 days. EXAM: CT ABDOMEN AND PELVIS WITHOUT CONTRAST TECHNIQUE: Multidetector CT imaging of the abdomen and pelvis was performed following the standard protocol without IV contrast. COMPARISON:  None. FINDINGS: Lower chest: Unremarkable Hepatobiliary: Unremarkable Pancreas: Unremarkable Spleen: Unremarkable Adrenals/Urinary Tract: Left mid upper kidney 4 mm nonobstructive calculus, image 69/5. Left mid kidney 2 mm nonobstructive calculus, image 67/5. No other urinary tract calculi. Stomach/Bowel: Unremarkable Vascular/Lymphatic: Aortoiliac atherosclerotic vascular disease. Reproductive: Coarse calcifications centrally in the prostate gland. Other: No supplemental non-categorized findings. Musculoskeletal: Degenerative spurring of both hips. Mild sclerosis posteriorly in the right iliac bone, nonspecific lumbar spondylosis and degenerative disc disease causing impingement at L4-5 and L5-S1. IMPRESSION: 1. Two nonobstructive left renal calculi. A specific cause for the patient's flank pain is not identified. 2. Faint nonspecific sclerosis posteriorly in the right iliac bone. No cortical destruction or soft tissue  involvement. Probably benign but technically nonspecific. If the patient has a history of malignancy with predilection for bony spread then further workup for example by bone scan might be considered. 3.  Aortic Atherosclerosis (ICD10-I70.0). 4. Degenerative spurring in both hips. Electronically Signed   By: Gaylyn Rong M.D.   On: 11/05/2017 11:50   Ct Angio Chest/abd/pel For Dissection W And/or Wo Contrast  Result Date: 11/05/2017 CLINICAL DATA:  47 year old male with left-sided flank pain for the past 7 days, hypertension, sharp chest pain, dizziness. Evaluate for aortic dissection. EXAM: CT ANGIOGRAPHY CHEST, ABDOMEN AND PELVIS TECHNIQUE: Multidetector CT imaging through the chest, abdomen and pelvis was performed using the standard protocol during bolus administration of intravenous contrast. Multiplanar reconstructed images and MIPs were obtained and reviewed to evaluate the vascular anatomy. CONTRAST:  ISOVUE-370 IOPAMIDOL (ISOVUE-370) INJECTION 76% COMPARISON:  CT scan of the abdomen and pelvis performed earlier today without intravenous contrast FINDINGS: CTA CHEST FINDINGS Cardiovascular: Initial noncontrast images demonstrate no evidence of acute intramural hematoma. Following administration of intravenous contrast, the aorta is well opacified. The aorta is normal in caliber. No evidence of aneurysm or dissection. Conventional 3 vessel  arch anatomy. A small amount of smooth fibrofatty atherosclerotic plaque is present at the origin of the left subclavian artery. This does not result in a hemodynamically significant stenosis. The heart is within normal limits for size. No pericardial effusion. Normal caliber main pulmonary artery. Mediastinum/Nodes: Unremarkable CT appearance of the thyroid gland. No suspicious mediastinal or hilar adenopathy. No soft tissue mediastinal mass. The thoracic esophagus is unremarkable. Lungs/Pleura: Lungs are clear. No pleural effusion or pneumothorax.  Musculoskeletal: No acute fracture or aggressive appearing lytic or blastic osseous lesion. Review of the MIP images confirms the above findings. CTA ABDOMEN AND PELVIS FINDINGS VASCULAR Aorta: Minimal atherosclerotic vascular calcifications along the abdominal aorta. No evidence of dissection or aneurysm. No irregular or ulcerated atherosclerotic plaque. Celiac: Patent without evidence of aneurysm, dissection, vasculitis or significant stenosis. SMA: Patent without evidence of aneurysm, dissection, vasculitis or significant stenosis. Replaced right hepatic artery. Renals: Both renal arteries are patent without evidence of aneurysm, dissection, vasculitis, fibromuscular dysplasia or significant stenosis. IMA: Patent without evidence of aneurysm, dissection, vasculitis or significant stenosis. Inflow: Patent without evidence of aneurysm, dissection, vasculitis or significant stenosis. Veins: No obvious venous abnormality within the limitations of this arterial phase study. Review of the MIP images confirms the above findings. NON-VASCULAR Hepatobiliary: Normal hepatic contour and morphology. No discrete hepatic lesions. Normal appearance of the gallbladder. No intra or extrahepatic biliary ductal dilatation. Pancreas: Unremarkable. No pancreatic ductal dilatation or surrounding inflammatory changes. Spleen: Normal in size without focal abnormality. Adrenals/Urinary Tract: Normal adrenal glands. 4 mm nonobstructing nephrolithiasis in the upper pole of the left renal collecting system. No evidence of hydronephrosis. No stones on the right. No enhancing renal mass. The ureters and bladder are unremarkable. Stomach/Bowel: No evidence of obstruction or focal bowel wall thickening. Normal appendix in the right lower quadrant. The terminal ileum is unremarkable. Lymphatic: No suspicious lymphadenopathy. Reproductive: Prostate is unremarkable. Other: No abdominal wall hernia or abnormality. No abdominopelvic ascites.  Musculoskeletal: No acute fracture or aggressive appearing lytic or blastic osseous lesion. Nonspecific sclerosis in the right iliac bone as seen on the recent prior CT scan. Review of the MIP images confirms the above findings. IMPRESSION: 1. No evidence of aortic dissection, aneurysm or acute arterial abnormality. 2. No acute cardiopulmonary process. 3. 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 Aortic Atherosclerosis (ICD10-170.0). 4. Nonobstructing left nephrolithiasis again noted. Electronically Signed   By: Malachy Moan M.D.   On: 11/05/2017 13:50    Procedures Procedures (including critical care time)  Medications Ordered in ED Medications  sodium chloride 0.9 % bolus 500 mL (500 mLs Intravenous New Bag/Given 11/05/17 1324)  iopamidol (ISOVUE-370) 76 % injection 100 mL (100 mLs Intravenous Contrast Given 11/05/17 1247)     Initial Impression / Assessment and Plan / ED Course  I have reviewed the triage vital signs and the nursing notes.  Pertinent labs & imaging results that were available during my care of the patient were reviewed by me and considered in my medical decision making (see chart for details).    Patient presents with intermittent flank pain for 1 week possibly similar to kidney stone but also worse with movement.  Discussed CT scan to look for further details.  CT stone study no acute findings, nonspecific bone findings.  Patient will need close outpatient follow-up for further work-up.  Patient's blood pressure is elevated he has a history of high blood pressure.  Patient chest x-ray no acute findings no widening of mediastinum.  Patient  does not want pain meds at this time.  Patient is low risk for cardiac no chest pain for a few days, troponin negative, EKG no acute findings.  Discussed further work-up and possible stress test through primary doctor.  Blood work reviewed unremarkable. Urine no hematuria. Upon  discharge instructions patient still having persistent left flank pain and with atypical accentuated pulses in his neck and groin along with atypical chest pain plan for CT angiogram to ensure no acute pathology of his aorta.  Discussed follow-up with primary doctor otherwise.  CT angiogram no acute findings.  Patient to follow-up outpatient Results and differential diagnosis were discussed with the patient/parent/guardian. Xrays were independently reviewed by myself.  Close follow up outpatient was discussed, comfortable with the plan.   Medications  sodium chloride 0.9 % bolus 500 mL (500 mLs Intravenous New Bag/Given 11/05/17 1324)  iopamidol (ISOVUE-370) 76 % injection 100 mL (100 mLs Intravenous Contrast Given 11/05/17 1247)    Vitals:   11/05/17 1056 11/05/17 1100 11/05/17 1200 11/05/17 1212  BP:    (!) 144/95  Pulse: 73 71 76 73  Resp: 16 13 17 19   Temp:      TempSrc:      SpO2: 100% 100% 99% 97%  Weight:      Height:        Final diagnoses:  Acute left flank pain  Nonspecific chest pain    Final Clinical Impressions(s) / ED Diagnoses   Final diagnoses:  Acute left flank pain  Nonspecific chest pain    ED Discharge Orders    None       Blane Ohara, MD 11/05/17 1225    Blane Ohara, MD 11/05/17 1359

## 2017-11-05 NOTE — ED Notes (Signed)
Patient transported to X-ray 

## 2017-11-05 NOTE — ED Triage Notes (Addendum)
Pt c/o left sided flank pain for about 7 days.  Pt states the pain is progressing upward.  Pt also having intermittent dizziness.  No fever.  No N/V.  Pt seen for same at walk in clinic and has been treated for pulled muscle but pain has not changed.  Pt also feel pulse in his neck and groin for two days, intermittent along with chest tightness.

## 2019-12-03 IMAGING — CT CT RENAL STONE PROTOCOL
2 of 4 series · 16 of 46 positions shown, 18 images · non-contrast
Comparison: None.

CLINICAL DATA: Left flank pain for 7 days.

EXAM:
CT ABDOMEN AND PELVIS WITHOUT CONTRAST
TECHNIQUE: Multidetector CT imaging of the abdomen and pelvis was performed
following the standard protocol without IV contrast.

[Series 2: axial st · axial · 0.91mm/px · z∈[-561,-76]mm · 13 of 107 slices shown, 15 images]
[im 5/107  soft-tissue]
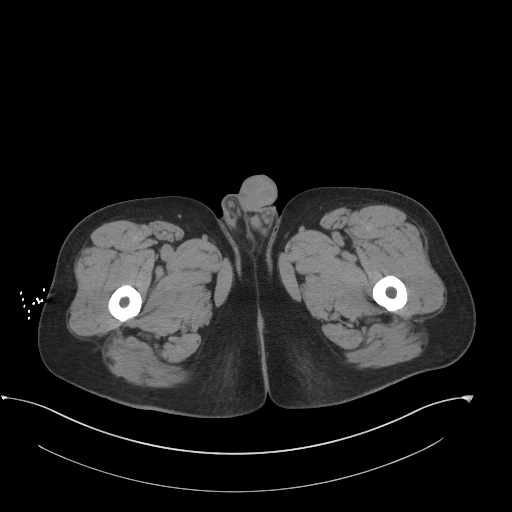
[im 5/107  bone]
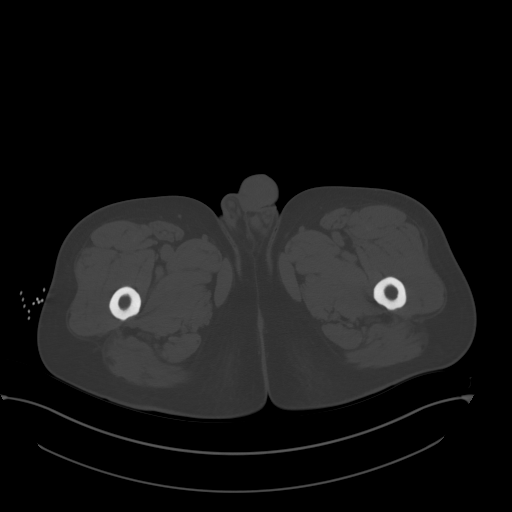
[im 14/107  soft-tissue]
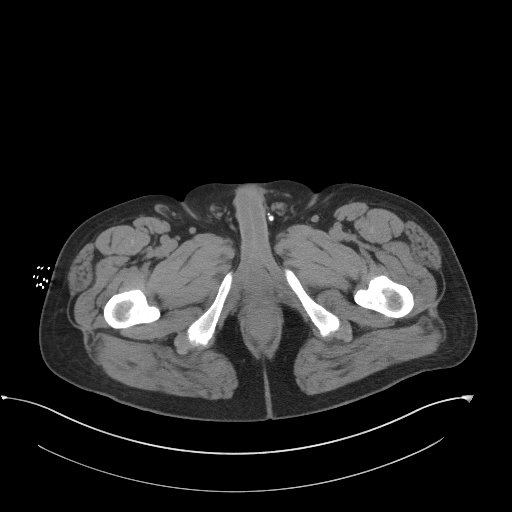
[im 23/107  soft-tissue]
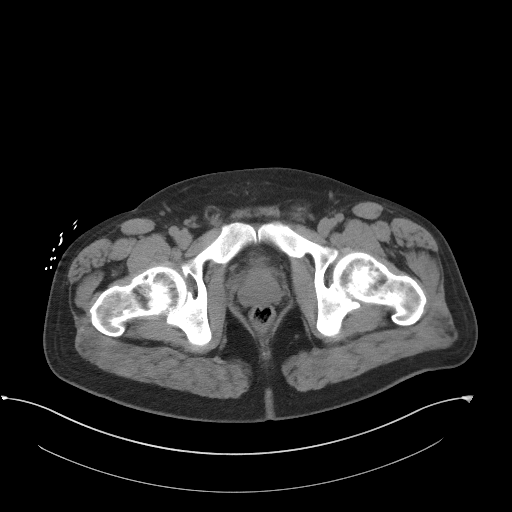
[im 31/107  soft-tissue]
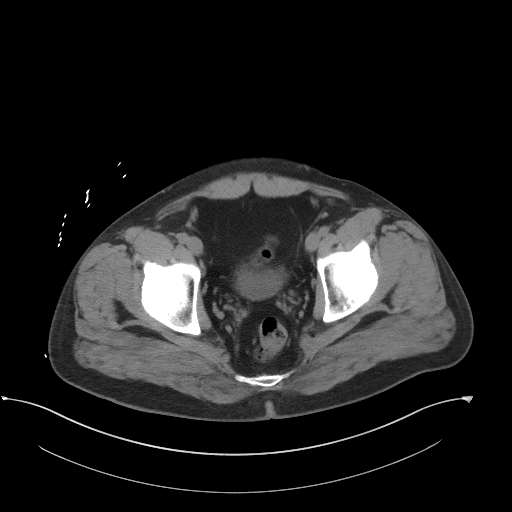
[im 36/107  soft-tissue]
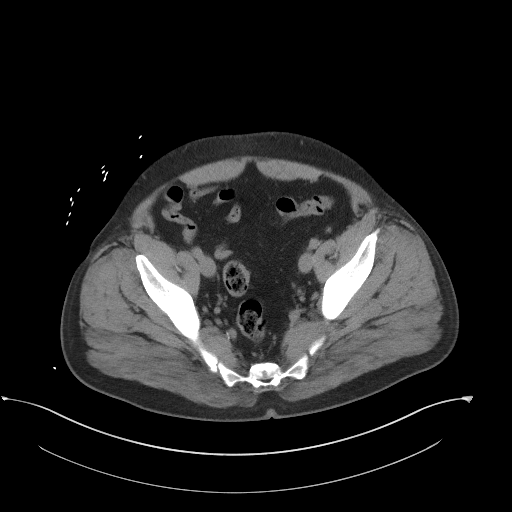
[im 45/107  soft-tissue]
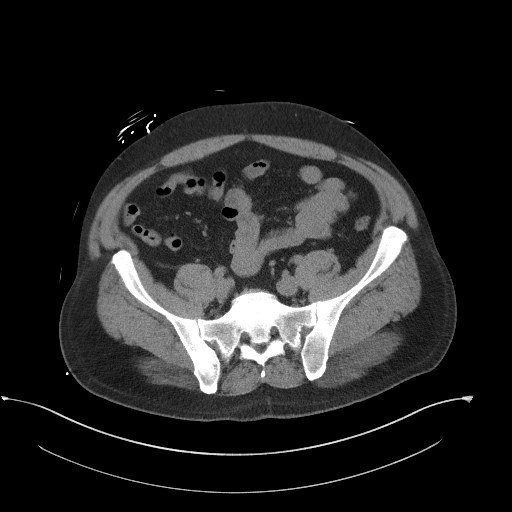
[im 54/107  soft-tissue]
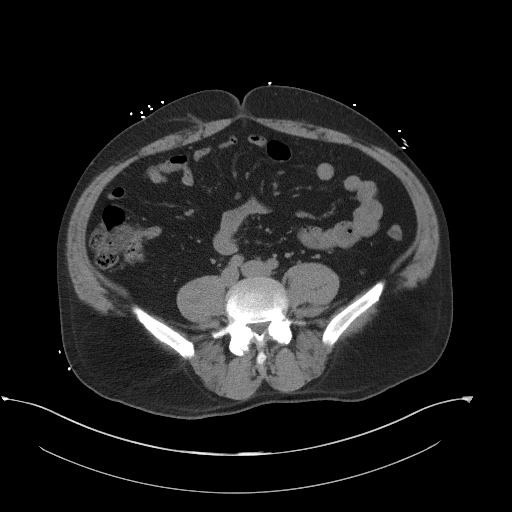
[im 62/107  soft-tissue]
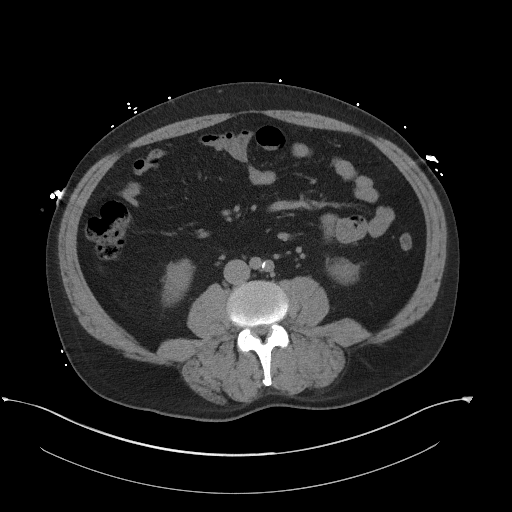
[im 71/107  soft-tissue]
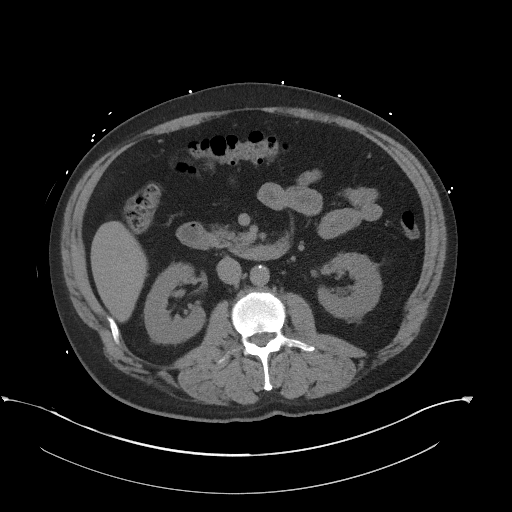
[im 71/107  bone]
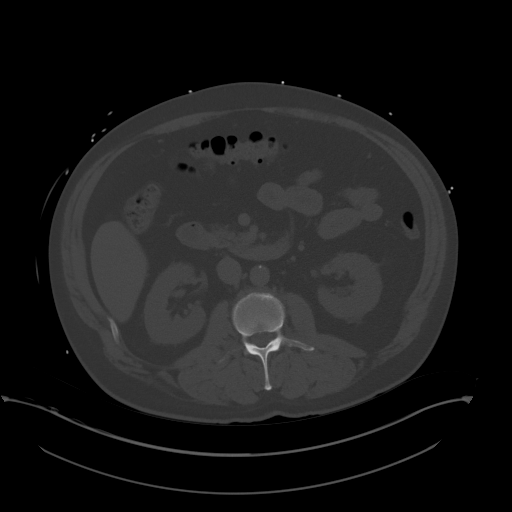
[im 76/107  soft-tissue]
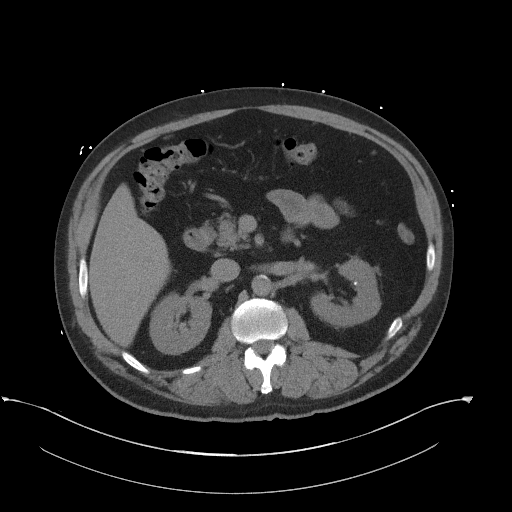
[im 84/107  soft-tissue]
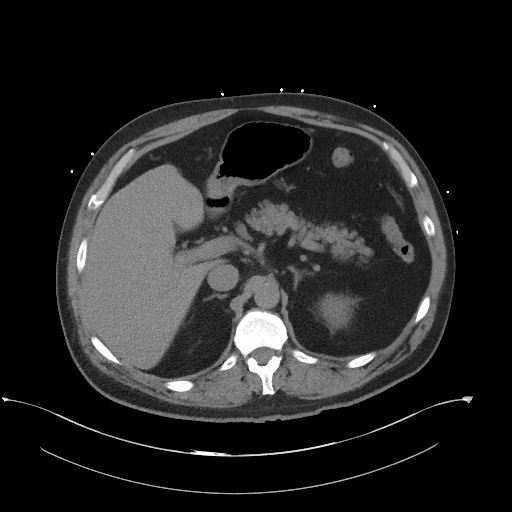
[im 93/107  soft-tissue]
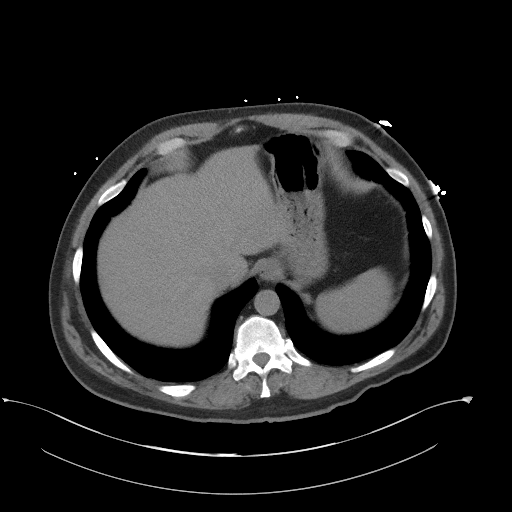
[im 102/107  soft-tissue]
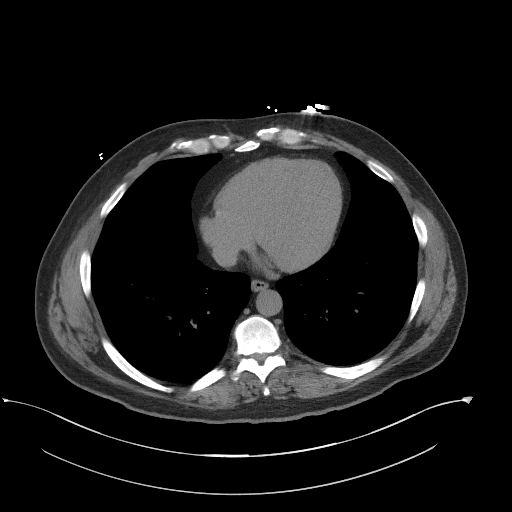

[Series 5: coronal st · coronal · 0.84mm/px · 3 of 111 slices shown]
[im 37/111  soft-tissue]
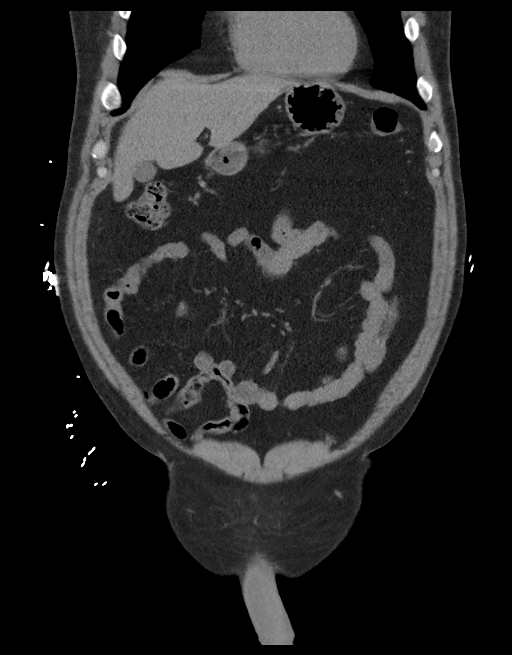
[im 49/111  soft-tissue]
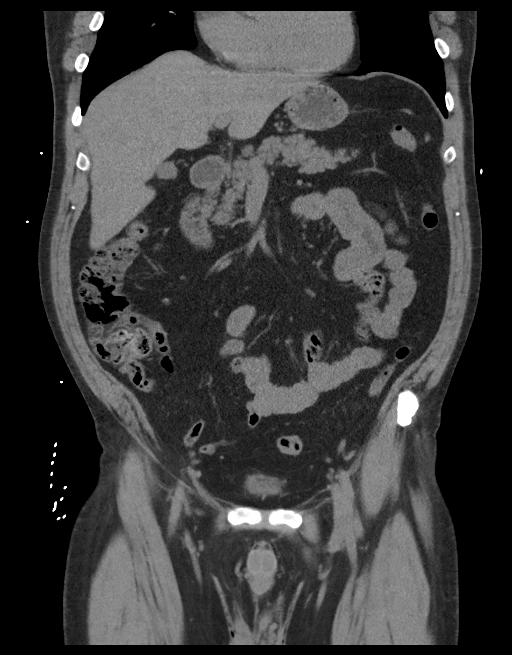
[im 62/111  soft-tissue]
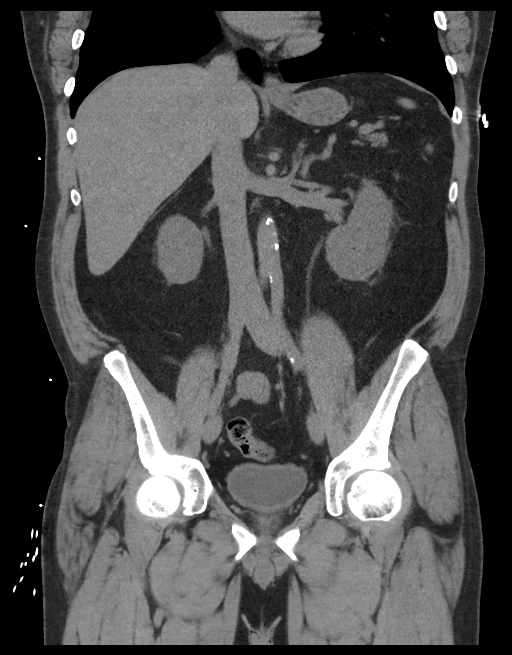

[16 of 46 positions shown; findings below may reference images not displayed]

FINDINGS: Lower chest: Unremarkable

Hepatobiliary: Unremarkable

Pancreas: Unremarkable

Spleen: Unremarkable

Adrenals/Urinary Tract: Left mid upper kidney 4 mm nonobstructive
calculus, image 69/5.

Left mid kidney 2 mm nonobstructive calculus, image 67/5.

No other urinary tract calculi.

Stomach/Bowel: Unremarkable

Vascular/Lymphatic: Aortoiliac atherosclerotic vascular disease.

Reproductive: Coarse calcifications centrally in the prostate gland.

Other: No supplemental non-categorized findings.

Musculoskeletal: Degenerative spurring of both hips. Mild sclerosis
posteriorly in the right iliac bone, nonspecific lumbar spondylosis
and degenerative disc disease causing impingement at L4-5 and L5-S1.
IMPRESSION: 1. Two nonobstructive left renal calculi. A specific cause for the
patient's flank pain is not identified.
2. Faint nonspecific sclerosis posteriorly in the right iliac bone.
No cortical destruction or soft tissue involvement. Probably benign
but technically nonspecific. If the patient has a history of
malignancy with predilection for bony spread then further workup for
example by bone scan might be considered.
3.  Aortic Atherosclerosis ([id number]).
4. Degenerative spurring in both hips.

## 2019-12-03 IMAGING — DX DG CHEST 2V
2 series · 2 of 2 positions shown · non-contrast
Comparison: None.

CLINICAL DATA: Chest pain and nausea for 2 days.

EXAM:
CHEST - 2 VIEW

[chest pa]
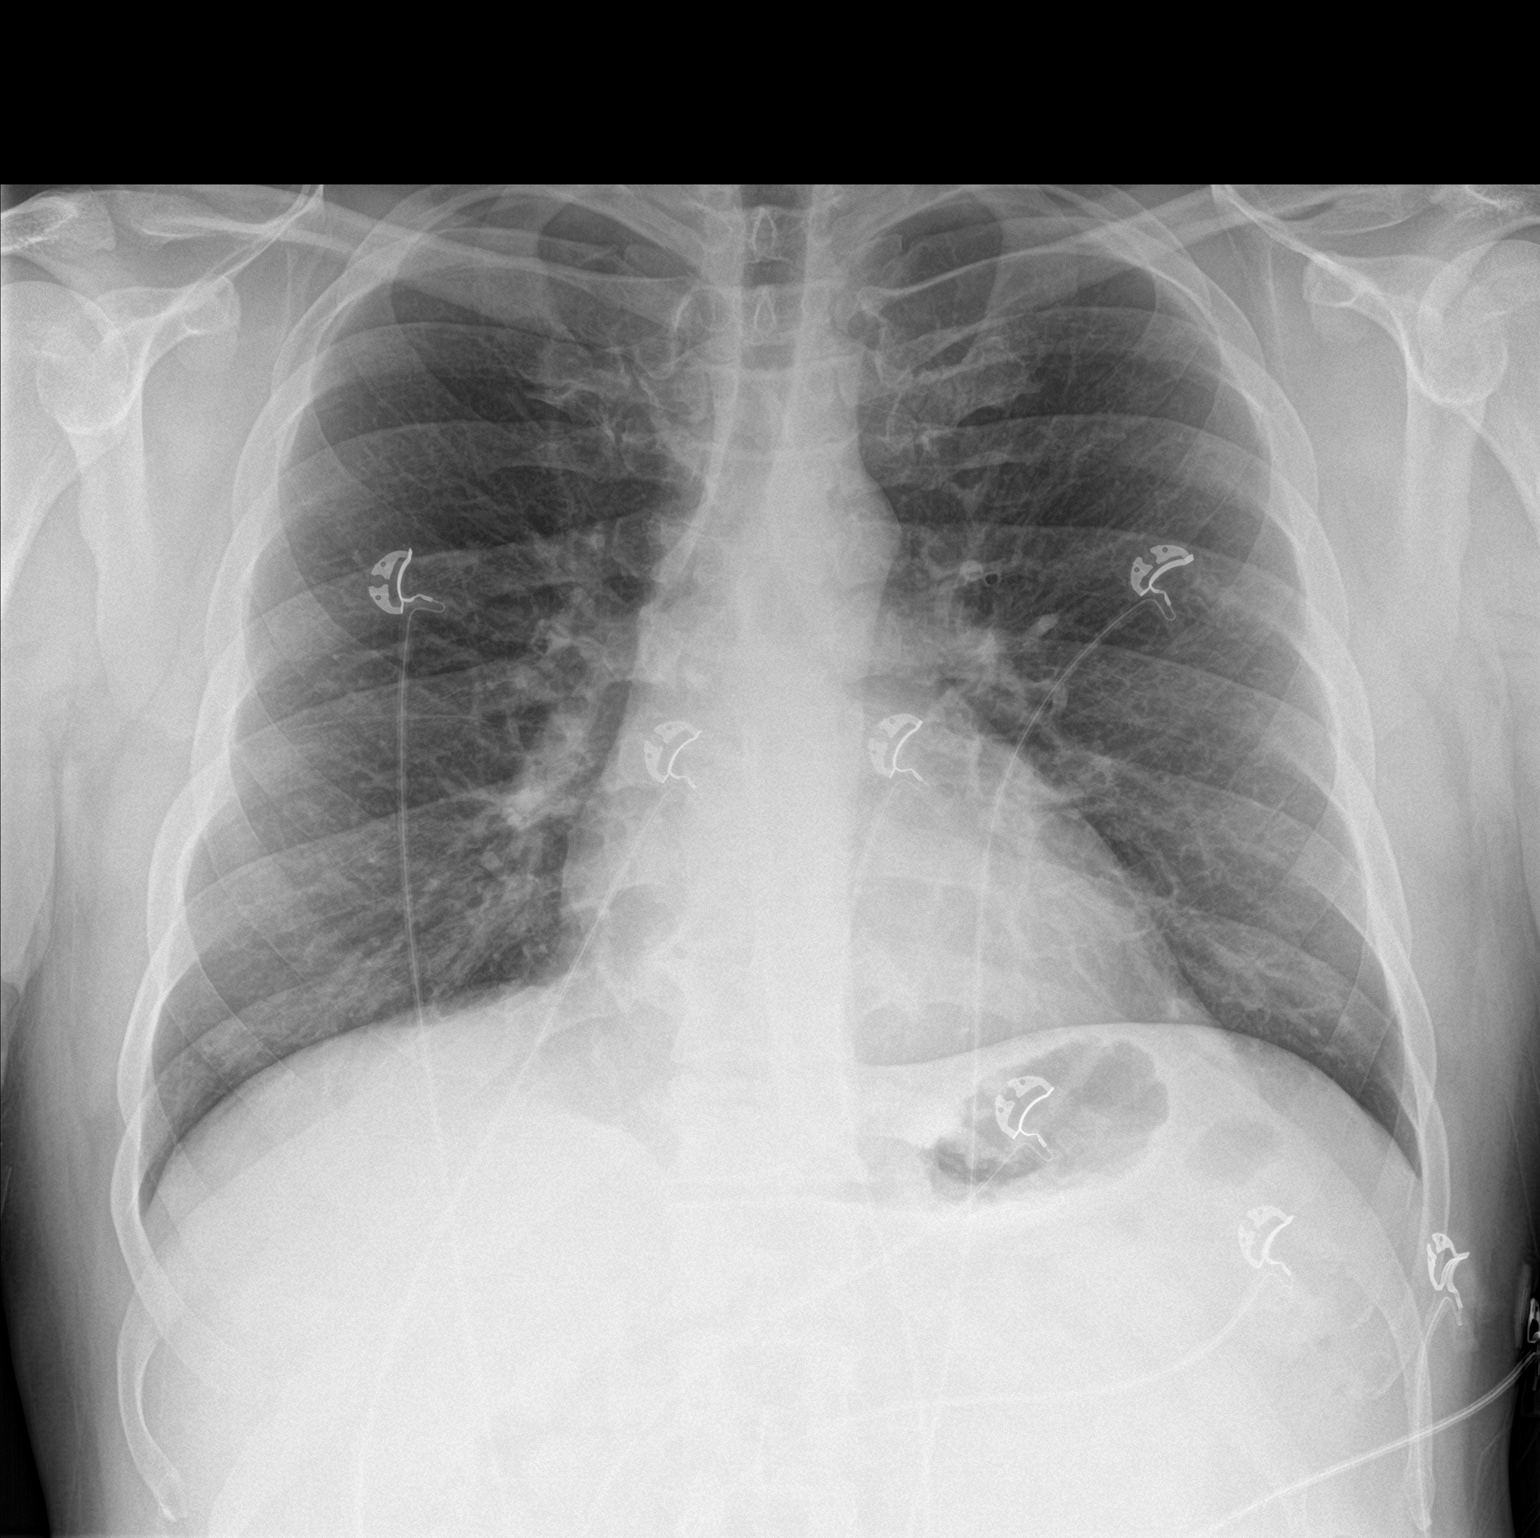

[chest lat]
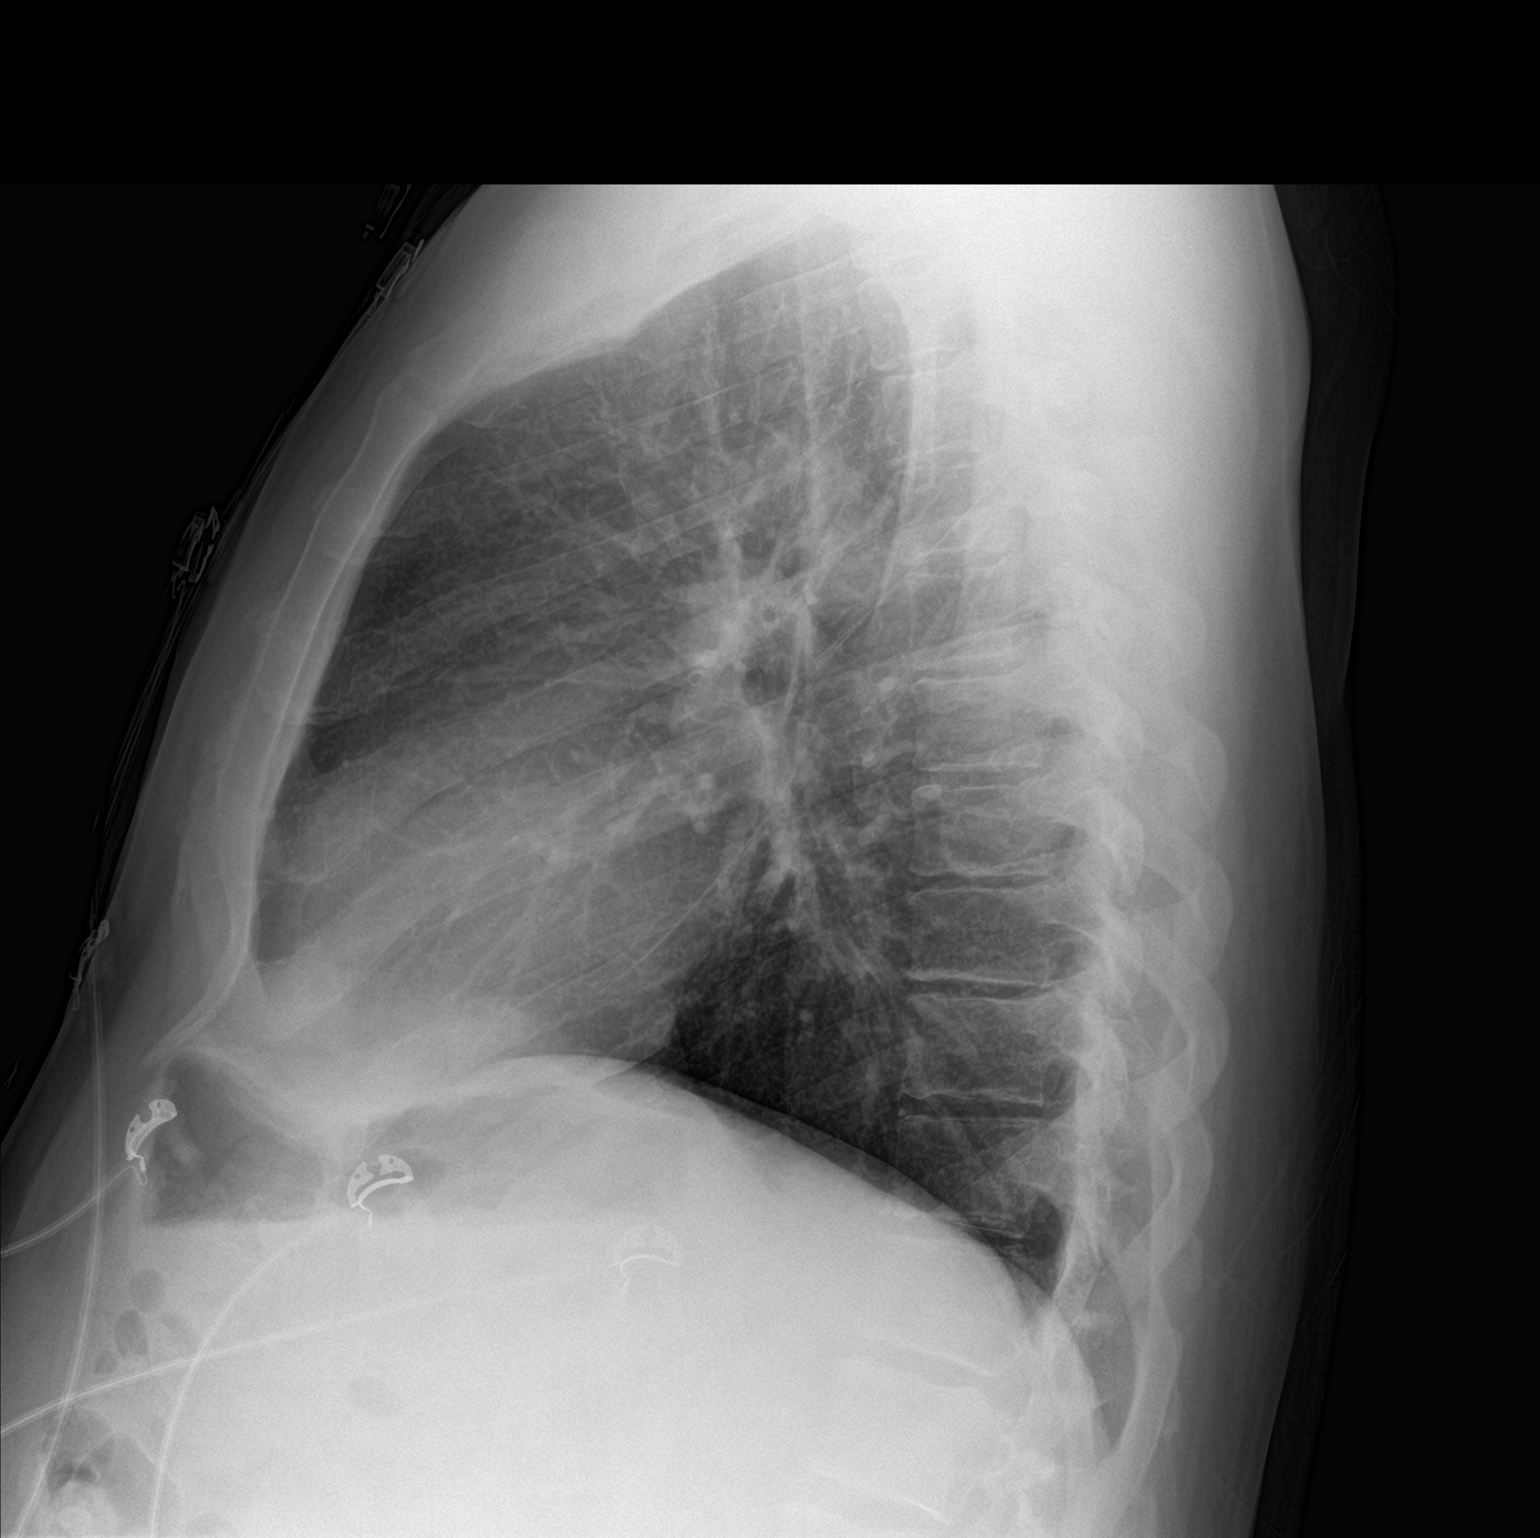

[2 of 2 positions shown; findings below may reference images not displayed]

FINDINGS: The cardiac silhouette, mediastinal and hilar contours are normal.
The lungs are clear. No pleural effusion. The bony thorax is intact.
IMPRESSION: Normal chest x-ray.
